# Patient Record
Sex: Male | Born: 2000 | Race: White | Hispanic: No | Marital: Single | Smoking: Never smoker
Health system: Southern US, Community
[De-identification: ages and names within clinical notes are randomized; demographics above are authoritative.]

## PROBLEM LIST (undated history)

## (undated) DIAGNOSIS — J45909 Unspecified asthma, uncomplicated: Secondary | ICD-10-CM

---
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
## 2023-01-11 ENCOUNTER — Ambulatory Visit
Admission: EM | Admit: 2023-01-11 | Discharge: 2023-01-11 | Disposition: A | Discharge status: Home / Self Care | Payer: MEDICAID | Attending: Nurse Practitioner | Admitting: Nurse Practitioner

## 2023-01-11 ENCOUNTER — Ambulatory Visit: Payer: Self-pay

## 2023-01-11 DIAGNOSIS — H60502 Unspecified acute noninfective otitis externa, left ear: Secondary | ICD-10-CM | POA: Diagnosis not present

## 2023-01-11 DIAGNOSIS — H6593 Unspecified nonsuppurative otitis media, bilateral: Secondary | ICD-10-CM

## 2023-01-11 MED ORDER — OFLOXACIN 0.3 % OT SOLN: 5.0000 [drp] | Freq: Two times a day (BID) | OTIC | 0 refills | Status: AC

## 2023-01-11 MED ORDER — AMOXICILLIN-POT CLAVULANATE 875-125 MG PO TABS: 1.0000 | ORAL_TABLET | Freq: Two times a day (BID) | ORAL | 0 refills | Status: DC

## 2023-01-11 NOTE — ED Triage Notes (Signed)
Pt reports he has bilateral ear pain x 2  days.     Took left over amoxicillin twice

## 2023-01-11 NOTE — ED Provider Notes (Signed)
[facility]    CSN: [id number] Arrival date & time: 01/11/23  1123      History   Chief Complaint No chief complaint on file.   HPI Adam Fletcher is a 22 y.o. male.   The history is provided by the patient.   The patient presents for a 2-day history of bilateral ear pain.  Patient states that the hearing is also decreased in his ears.  Patient states that the left ear hurts when he sticks his finger inside of it or when he tries to move around.  He denies fever, chills, headache, ear drainage, nasal congestion, runny nose, or cough.  Patient states he was at the beach last week and did a a lot of swimming.  Patient states that he had to amoxicillin tablets that he took on separate days, but not not noticed any relief. History reviewed. No pertinent past medical history.  There are no problems to display for this patient.   History reviewed. No pertinent surgical history.     Home Medications    Prior to Admission medications   Not on File    Family History History reviewed. No pertinent family history.  Social History Social History   Tobacco Use   Smoking status: Never   Smokeless tobacco: Never  Vaping Use   Vaping status: Never Used  Substance Use Topics   Alcohol use: Yes     Allergies   Patient has no known allergies.   Review of Systems Review of Systems Per HPI  Physical Exam Triage Vital Signs ED Triage Vitals  Encounter Vitals Group     BP 01/11/23 1128 135/69     Systolic BP Percentile --      Diastolic BP Percentile --      Pulse Rate 01/11/23 1128 99     Resp 01/11/23 1128 18     Temp 01/11/23 1128 98.1 F (36.7 C)     Temp src --      SpO2 01/11/23 1128 95 %     Weight --      Height --      Head Circumference --      Peak Flow --      Pain Score 01/11/23 1130 0     Pain Loc --      Pain Education --      Exclude from Growth Chart --    No data found.  Updated Vital Signs BP 135/69 (BP Location: Right Arm)    Pulse 99   Temp 98.1 F (36.7 C)   Resp 18   SpO2 95%   Visual Acuity Right Eye Distance:   Left Eye Distance:   Bilateral Distance:    Right Eye Near:   Left Eye Near:    Bilateral Near:     Physical Exam Vitals and nursing note reviewed.  Constitutional:      General: He is not in acute distress.    Appearance: Normal appearance.  HENT:     Head: Normocephalic.     Right Ear: External ear normal. Decreased hearing noted. Tympanic membrane is erythematous and bulging.     Left Ear: External ear normal. Decreased hearing noted. Swelling (Swelling and erythema noted to the left ear canal) and tenderness (Tenderness to the tragus and pinna of the left ear) present. Tympanic membrane is erythematous and bulging.     Nose: Nose normal.     Mouth/Throat:     Mouth: Mucous membranes are moist.  Eyes:  Extraocular Movements: Extraocular movements intact.     Conjunctiva/sclera: Conjunctivae normal.     Pupils: Pupils are equal, round, and reactive to light.  Cardiovascular:     Rate and Rhythm: Normal rate and regular rhythm.     Pulses: Normal pulses.     Heart sounds: Normal heart sounds.  Pulmonary:     Effort: Pulmonary effort is normal.     Breath sounds: Normal breath sounds.  Abdominal:     General: Bowel sounds are normal.     Palpations: Abdomen is soft.     Tenderness: There is no abdominal tenderness.  Musculoskeletal:     Cervical back: Normal range of motion.  Skin:    General: Skin is warm and dry.  Neurological:     General: No focal deficit present.     Mental Status: He is alert and oriented to person, place, and time.  Psychiatric:        Mood and Affect: Mood normal.        Behavior: Behavior normal.      UC Treatments / Results  Labs (all labs ordered are listed, but only abnormal results are displayed) Labs Reviewed - No data to display  EKG   Radiology No results found.  Procedures Procedures (including critical care  time)  Medications Ordered in UC Medications - No data to display  Initial Impression / Assessment and Plan / UC Course  I have reviewed the triage vital signs and the nursing notes.  Pertinent labs & imaging results that were available during my care of the patient were reviewed by me and considered in my medical decision making (see chart for details).  The patient is well-appearing, he is in no acute distress, vital signs are stable.  On exam, patient with bilateral bulging and erythematous TMs.  The left ear has increased redness and swelling of the left ear canal.  He also has tenderness to the tragus and pinna of the left ear.  Will treat otitis media with Augmentin 875/125 mg twice daily for the next 7 days and for the otitis externa, will treat with Floxin 0.3% eardrops.  Supportive care recommendations were provided and discussed with the patient to include over-the-counter analgesics for pain or discomfort, warm compresses to the ears, and avoiding entrance of water inside of the ears while symptoms persist.  Patient advised to follow-up with his PCP if symptoms fail to improve.  Patient is in agreement with this plan of care and verbalizes understanding.  All questions were answered.  Patient is stable for discharge.   Final Clinical Impressions(s) / UC Diagnoses   Final diagnoses:  None   Discharge Instructions   None    ED Prescriptions   None    PDMP not reviewed this encounter.   Abran Cantor, NP 01/11/23 1149

## 2023-01-11 NOTE — Discharge Instructions (Signed)
Take medication as prescribed. May take over-the-counter Tylenol or ibuprofen as needed for pain, fever, or general discomfort. Warm compresses to the affected ear help with comfort. Do not stick anything inside the ear while symptoms persist. Avoid getting water inside of the ear while symptoms persist. If symptoms do not improve with this treatment, please follow-up with your primary care physician for further evaluation. Follow-up as needed.

## 2023-07-01 ENCOUNTER — Encounter (HOSPITAL_COMMUNITY): Payer: Self-pay

## 2023-07-01 ENCOUNTER — Emergency Department (HOSPITAL_COMMUNITY)
Admission: EM | Admit: 2023-07-01 | Discharge: 2023-07-01 | Disposition: A | Discharge status: Home / Self Care | Payer: PRIVATE HEALTH INSURANCE | Attending: Emergency Medicine | Admitting: Emergency Medicine

## 2023-07-01 ENCOUNTER — Emergency Department (HOSPITAL_COMMUNITY): Payer: PRIVATE HEALTH INSURANCE

## 2023-07-01 DIAGNOSIS — S060XAA Concussion with loss of consciousness status unknown, initial encounter: Secondary | ICD-10-CM | POA: Diagnosis not present

## 2023-07-01 DIAGNOSIS — Y92410 Unspecified street and highway as the place of occurrence of the external cause: Secondary | ICD-10-CM | POA: Diagnosis not present

## 2023-07-01 DIAGNOSIS — S060X9A Concussion with loss of consciousness of unspecified duration, initial encounter: Secondary | ICD-10-CM

## 2023-07-01 DIAGNOSIS — S0083XA Contusion of other part of head, initial encounter: Secondary | ICD-10-CM | POA: Insufficient documentation

## 2023-07-01 DIAGNOSIS — S0990XA Unspecified injury of head, initial encounter: Secondary | ICD-10-CM | POA: Diagnosis present

## 2023-07-01 HISTORY — DX: Unspecified asthma, uncomplicated: J45.909

## 2023-07-01 MED ORDER — METHOCARBAMOL 500 MG PO TABS: 500.0000 mg | ORAL_TABLET | Freq: Three times a day (TID) | ORAL | 0 refills | Status: DC

## 2023-07-01 MED ORDER — IBUPROFEN 800 MG PO TABS: 800.0000 mg | ORAL_TABLET | Freq: Three times a day (TID) | ORAL | 0 refills | Status: DC

## 2023-07-01 NOTE — Discharge Instructions (Addendum)
 You likely have a concussion.  This may cause some feelings of fatigue nausea and headache that can last from days to weeks.  I recommend that you take the medication as directed.  Muscle relaxer may cause dizziness so do not drive or operate machinery while taking this medicine.  You may also alternate ice and heat to your neck and head.  I have also listed a couple of clinics in the area that you may contact to establish primary care if needed.  Also, return to the emergency department if you develop any new or worsening symptoms.
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
## 2023-11-27 ENCOUNTER — Ambulatory Visit
Admission: EM | Admit: 2023-11-27 | Discharge: 2023-11-27 | Disposition: A | Discharge status: Home / Self Care | Payer: PRIVATE HEALTH INSURANCE | Attending: Nurse Practitioner | Admitting: Nurse Practitioner

## 2023-11-27 DIAGNOSIS — M5441 Lumbago with sciatica, right side: Secondary | ICD-10-CM

## 2023-11-27 MED ORDER — METHOCARBAMOL 500 MG PO TABS: 500.0000 mg | ORAL_TABLET | Freq: Three times a day (TID) | ORAL | 0 refills | Status: AC | PRN

## 2023-11-27 MED ORDER — KETOROLAC TROMETHAMINE 30 MG/ML IJ SOLN
30.0000 mg | Freq: Once | INTRAMUSCULAR | Status: AC
  Administered 2023-11-27: 30 mg via INTRAMUSCULAR

## 2023-11-27 NOTE — ED Provider Notes (Signed)
 [facility]    CSN: [id number] Arrival date & time: 11/27/23  1016      History   Chief Complaint No chief complaint on file.   HPI Adam Fletcher is a 23 y.o. male.   Patient presents today for 2-day history of acute left-sided low back pain.  Reports he lifts heavy objects frequently at work.  Does not know if he hurt his back at work, pain began when he woke up on Tuesday morning.  He endorses sharp pain that occasionally radiates down his legs when he goes from sitting to standing or when he is walking.  No numbness or tingling in toes, decreased sensation of lower extremities, saddle anesthesia, new bowel or bladder incontinence, urinary frequency, dysuria, hematuria, or weakness of lower extremities since the pain began.  Has been taking Tylenol, Advil , ibuprofen  for symptoms without much improvement.  Denies history of back surgeries.    Past Medical History:  Diagnosis Date   Asthma     There are no active problems to display for this patient.   History reviewed. No pertinent surgical history.     Home Medications    Prior to Admission medications   Medication Sig Start Date End Date Taking? Authorizing Provider  methocarbamol  (ROBAXIN ) 500 MG tablet Take 1 tablet (500 mg total) by mouth every 8 (eight) hours as needed for muscle spasms. Do not take with alcohol or while driving or operating heavy machinery.  May cause drowsiness. 11/27/23   Wilhemena Harbour, NP    Family History History reviewed. No pertinent family history.  Social History Social History   Tobacco Use   Smoking status: Never   Smokeless tobacco: Never  Vaping Use   Vaping status: Never Used  Substance Use Topics   Alcohol use: Yes   Drug use: Never     Allergies   Patient has no known allergies.   Review of Systems Review of Systems Per HPI  Physical Exam Triage Vital Signs ED Triage Vitals  Encounter Vitals Group     BP 11/27/23 1126 128/85     Systolic BP  Percentile --      Diastolic BP Percentile --      Pulse Rate 11/27/23 1126 71     Resp 11/27/23 1126 18     Temp 11/27/23 1126 98.7 F (37.1 C)     Temp Source 11/27/23 1126 Oral     SpO2 11/27/23 1126 97 %     Weight --      Height --      Head Circumference --      Peak Flow --      Pain Score 11/27/23 1128 7     Pain Loc --      Pain Education --      Exclude from Growth Chart --    No data found.  Updated Vital Signs BP 128/85 (BP Location: Right Arm)   Pulse 71   Temp 98.7 F (37.1 C) (Oral)   Resp 18   SpO2 97%   Visual Acuity Right Eye Distance:   Left Eye Distance:   Bilateral Distance:    Right Eye Near:   Left Eye Near:    Bilateral Near:     Physical Exam Vitals and nursing note reviewed.  Constitutional:      General: He is not in acute distress.    Appearance: Normal appearance. He is not toxic-appearing.  HENT:     Head: Normocephalic and atraumatic.  Mouth/Throat:     Mouth: Mucous membranes are moist.     Pharynx: Oropharynx is clear.  Pulmonary:     Effort: Pulmonary effort is normal. No respiratory distress.  Abdominal:     Tenderness: There is no right CVA tenderness or left CVA tenderness.  Musculoskeletal:     Lumbar back: Negative right straight leg raise test and negative left straight leg raise test.       Back:     Right lower leg: No edema.     Left lower leg: No edema.     Comments: Inspection: no swelling, bruising, obvious deformity or redness to left low back Palpation: Left low back generally nontender to palpation; pain as marked with movement; no obvious deformities palpated ROM: Full ROM to bilateral lower extremities Strength: 5/5 bilateral lower extremities Neurovascular: neurovascularly intact in distal bilateral lower extremities  Skin:    General: Skin is warm and dry.     Capillary Refill: Capillary refill takes less than 2 seconds.     Coloration: Skin is not jaundiced or pale.     Findings: No erythema.   Neurological:     Mental Status: He is alert and oriented to person, place, and time.     Motor: No weakness.     Gait: Gait normal.  Psychiatric:        Behavior: Behavior is cooperative.      UC Treatments / Results  Labs (all labs ordered are listed, but only abnormal results are displayed) Labs Reviewed - No data to display  EKG   Radiology No results found.  Procedures Procedures (including critical care time)  Medications Ordered in UC Medications  ketorolac (TORADOL) 30 MG/ML injection 30 mg (30 mg Intramuscular Given 11/27/23 1146)    Initial Impression / Assessment and Plan / UC Course  I have reviewed the triage vital signs and the nursing notes.  Pertinent labs & imaging results that were available during my care of the patient were reviewed by me and considered in my medical decision making (see chart for details).   Patient is well-appearing, normotensive, afebrile, not tachycardic, not tachypneic, oxygenating well on room air.   1. Acute left-sided low back pain with right-sided sciatica Suspect musculoskeletal cause versus sciatic nerve inflammation Pain treated with Toradol 30 mg IM in urgent care today, avoid NSAIDs for 48 hours, continue Tylenol In addition, recommended use of Robaxin  as needed for muscular pain and other supportive care discussed to include warm compresses and light range of motion/stretching exercises-handout given today Return and ER precautions discussed with patient Work excuse provided  The patient was given the opportunity to ask questions.  All questions answered to their satisfaction.  The patient is in agreement to this plan.   Final Clinical Impressions(s) / UC Diagnoses   Final diagnoses:  Acute left-sided low back pain with right-sided sciatica     Discharge Instructions      The back pain you are having is consistent with a muscle strain and/or sciatic nerve irritation.  We have given you an injection of a medicine  to help with pain and inflammation today.  Please avoid all NSAIDs including Advil , Aleve, ibuprofen , and naproxen for 48 hours, then you can continue ibuprofen  800 mg every 8 hours as needed for pain.  You can also take Tylenol 500 to 1000 mg every 6 hours for pain and use a muscle relaxant every 8 hours as needed for muscular pain.  Recommend warm compresses and light range  of motion/stretching exercises; exercises have been provided today.    Please seek care emergently if you develop new incontinence, weakness of lower extremities, or decreased sensation of lower extremities, or worsening pain.  ED Prescriptions     Medication Sig Dispense Auth. Provider   methocarbamol  (ROBAXIN ) 500 MG tablet Take 1 tablet (500 mg total) by mouth every 8 (eight) hours as needed for muscle spasms. Do not take with alcohol or while driving or operating heavy machinery.  May cause drowsiness. 21 tablet Wilhemena Harbour, NP      PDMP not reviewed this encounter.   Wilhemena Harbour, NP 11/27/23 1214

## 2023-11-27 NOTE — Discharge Instructions (Signed)
 The back pain you are having is consistent with a muscle strain and/or sciatic nerve irritation.  We have given you an injection of a medicine to help with pain and inflammation today.  Please avoid all NSAIDs including Advil , Aleve, ibuprofen , and naproxen for 48 hours, then you can continue ibuprofen  800 mg every 8 hours as needed for pain.  You can also take Tylenol 500 to 1000 mg every 6 hours for pain and use a muscle relaxant every 8 hours as needed for muscular pain.  Recommend warm compresses and light range of motion/stretching exercises; exercises have been provided today.    Please seek care emergently if you develop new incontinence, weakness of lower extremities, or decreased sensation of lower extremities, or worsening pain.

## 2023-11-27 NOTE — ED Triage Notes (Signed)
 Pt reports left side mid back pain, feels like a pinching, since Tuesday.
